# Patient Record
Sex: Male | Born: 1980 | Race: White | Hispanic: No | Smoking: Current every day smoker
Health system: Southern US, Community
[De-identification: ages and names within clinical notes are randomized; demographics above are authoritative.]

## PROBLEM LIST (undated history)

## (undated) DIAGNOSIS — F32A Depression, unspecified: Secondary | ICD-10-CM

## (undated) DIAGNOSIS — F329 Major depressive disorder, single episode, unspecified: Secondary | ICD-10-CM

## (undated) DIAGNOSIS — F1910 Other psychoactive substance abuse, uncomplicated: Secondary | ICD-10-CM

## (undated) HISTORY — DX: Depression, unspecified: F32.A

## (undated) HISTORY — DX: Major depressive disorder, single episode, unspecified: F32.9

## (undated) HISTORY — DX: Other psychoactive substance abuse, uncomplicated: F19.10

---
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
## 2004-09-11 ENCOUNTER — Emergency Department (HOSPITAL_COMMUNITY): Admission: EM | Admit: 2004-09-11 | Discharge: 2004-09-11 | Payer: Self-pay | Admitting: Emergency Medicine

## 2004-10-22 ENCOUNTER — Emergency Department (HOSPITAL_COMMUNITY): Admission: EM | Admit: 2004-10-22 | Discharge: 2004-10-22 | Payer: Self-pay | Admitting: Emergency Medicine

## 2005-02-10 ENCOUNTER — Emergency Department (HOSPITAL_COMMUNITY): Admission: EM | Admit: 2005-02-10 | Discharge: 2005-02-10 | Payer: Self-pay | Admitting: Emergency Medicine

## 2008-08-17 ENCOUNTER — Emergency Department (HOSPITAL_COMMUNITY): Admission: EM | Admit: 2008-08-17 | Discharge: 2008-08-17 | Payer: Self-pay | Admitting: Emergency Medicine

## 2008-08-26 ENCOUNTER — Emergency Department (HOSPITAL_COMMUNITY): Admission: EM | Admit: 2008-08-26 | Discharge: 2008-08-27 | Payer: Self-pay | Admitting: Emergency Medicine

## 2009-09-11 ENCOUNTER — Emergency Department (HOSPITAL_COMMUNITY): Admission: EM | Admit: 2009-09-11 | Discharge: 2009-09-11 | Payer: Self-pay | Admitting: Emergency Medicine

## 2010-08-14 NOTE — Discharge Summary (Signed)
[facility]  Patient:    Jermaine Mccann, Jermaine Mccann Visit Number: [id number] MRN: [id number]          Service Type: OBS Location: 3A A328 01 Attending Physician:  Lilyan Punt Dictated by:   Lilyan Punt, M.D. Admit Date:  05/20/2001 Discharge Date: 05/22/2001                             Discharge Summary  OBSERVATION NOTE/DISCHARGE:  CHIEF COMPLAINT:  Muscle aches, fever, cough, abdominal pain.  HISTORY OF PRESENT ILLNESS:  An 30 year old male who presented with head congestion, body aches, runny nose, cough, fever, chills, abdominal pains to the ED along with some vomiting.  Had been well previous.  PAST MEDICAL HISTORY:  No significant health problems.  ALLERGIES:  None.  SOCIAL HISTORY:  Lives with mother.  Does smoke.  FAMILY HISTORY:  Noncontributory.  REVIEW OF SYSTEMS:  Per above.  Mild headache.  No neck stiffness.  No rectal bleeding, no dysuria.  Has not noticed a decrease in urine frequency.  LABORATORY DATA:  A normal white count with a left shift.  CHEST X-RAY:  Nonspecific abdominal CT shows left lower lobe pneumonia and no obvious sign of appendicitis.  PHYSICAL EXAMINATION:  GENERAL:  No acute distress.  HEENT:  TMs normal.  Mucous membranes moist.  NECK:  Supple.  CHEST:  Clear to auscultation and normal.  There is some slight crackles noted in the left base.  HEART:  Regular.  ABDOMEN:  Soft with tenderness throughout more on the right side than the left side.  EXTREMITIES:  No edema.  SKIN:  Warm and dry.  HOSPITAL COURSE:  The patient was admitted in observation for pneumonia and was placed on IV antibiotics and monitored closely.  A recheck of his abdomen later on the day of May 21, 2001, showed no specific tenderness.  Also some diarrhea.  A stool for WBC and culture was done but it was felt the diarrhea was secondary to a viral process.  The patient was started on a regular diet and did well with  this.  On the morning of May 22, 2001, he had crackles throughout the left base but was not in respiratory distress.  His temperature was normal at the time although he did spike some fevers during the night.  His repeat blood count was a normal white blood count with no specific shift.  His discharge diagnosis summary includes: 1. Pneumonia. 2. Flu illness. 3. Diarrhea.  DISCHARGE INSTRUCTIONS: 1. Plenty of liquids. 2. Regular diet. 3. Imodium as needed for diarrhea. 4. Zithromax Z-Pak for antibiotics. 5. Off work this week. 6. Try to quit smoking.  Certainly do not smoke while being sick. 7. Return to work on May 29, 3001. 8. Follow up sooner in the office if shortness of breath or other problems   otherwise recheck in approximately 10 days. Dictated by:   Lilyan Punt, M.D. Attending Physician:  Lilyan Punt DD:  05/22/01 TD:  05/22/01 Job: [id number]

## 2017-06-04 ENCOUNTER — Emergency Department (HOSPITAL_COMMUNITY)
Admission: EM | Admit: 2017-06-04 | Discharge: 2017-06-04 | Disposition: A | Discharge status: Home / Self Care | Payer: No Typology Code available for payment source | Attending: Emergency Medicine | Admitting: Emergency Medicine

## 2017-06-04 ENCOUNTER — Emergency Department (HOSPITAL_COMMUNITY): Payer: No Typology Code available for payment source

## 2017-06-04 ENCOUNTER — Encounter (HOSPITAL_COMMUNITY): Payer: Self-pay | Admitting: Emergency Medicine

## 2017-06-04 DIAGNOSIS — S42021A Displaced fracture of shaft of right clavicle, initial encounter for closed fracture: Secondary | ICD-10-CM | POA: Diagnosis not present

## 2017-06-04 DIAGNOSIS — Y92410 Unspecified street and highway as the place of occurrence of the external cause: Secondary | ICD-10-CM | POA: Diagnosis not present

## 2017-06-04 DIAGNOSIS — Y998 Other external cause status: Secondary | ICD-10-CM | POA: Diagnosis not present

## 2017-06-04 DIAGNOSIS — R0789 Other chest pain: Secondary | ICD-10-CM | POA: Diagnosis not present

## 2017-06-04 DIAGNOSIS — S301XXA Contusion of abdominal wall, initial encounter: Secondary | ICD-10-CM

## 2017-06-04 DIAGNOSIS — Y9389 Activity, other specified: Secondary | ICD-10-CM | POA: Diagnosis not present

## 2017-06-04 DIAGNOSIS — F17210 Nicotine dependence, cigarettes, uncomplicated: Secondary | ICD-10-CM | POA: Insufficient documentation

## 2017-06-04 LAB — CBC WITH DIFFERENTIAL/PLATELET
BASOS ABS: 0 10*3/uL (ref 0.0–0.1)
BASOS PCT: 0 %
Eosinophils Absolute: 0.1 10*3/uL (ref 0.0–0.7)
Eosinophils Relative: 1 %
HEMATOCRIT: 43.8 % (ref 39.0–52.0)
HEMOGLOBIN: 14.3 g/dL (ref 13.0–17.0)
Lymphocytes Relative: 21 %
Lymphs Abs: 2.6 10*3/uL (ref 0.7–4.0)
MCH: 30.5 pg (ref 26.0–34.0)
MCHC: 32.6 g/dL (ref 30.0–36.0)
MCV: 93.4 fL (ref 78.0–100.0)
MONOS PCT: 7 %
Monocytes Absolute: 0.8 10*3/uL (ref 0.1–1.0)
NEUTROS ABS: 8.7 10*3/uL — AB (ref 1.7–7.7)
NEUTROS PCT: 71 %
Platelets: 387 10*3/uL (ref 150–400)
RBC: 4.69 MIL/uL (ref 4.22–5.81)
RDW: 13.5 % (ref 11.5–15.5)
WBC: 12.2 10*3/uL — ABNORMAL HIGH (ref 4.0–10.5)

## 2017-06-04 LAB — COMPREHENSIVE METABOLIC PANEL
ALBUMIN: 4.3 g/dL (ref 3.5–5.0)
ALK PHOS: 53 U/L (ref 38–126)
ALT: 23 U/L (ref 17–63)
AST: 42 U/L — ABNORMAL HIGH (ref 15–41)
Anion gap: 15 (ref 5–15)
BILIRUBIN TOTAL: 0.6 mg/dL (ref 0.3–1.2)
BUN: 6 mg/dL (ref 6–20)
CO2: 22 mmol/L (ref 22–32)
CREATININE: 0.78 mg/dL (ref 0.61–1.24)
Calcium: 9 mg/dL (ref 8.9–10.3)
Chloride: 102 mmol/L (ref 101–111)
GFR calc Af Amer: >60 mL/min (ref >60)
GFR calc non Af Amer: >60 mL/min (ref >60)
GLUCOSE: 150 mg/dL — AB (ref 65–99)
Potassium: 3 mmol/L — ABNORMAL LOW (ref 3.5–5.1)
Sodium: 139 mmol/L (ref 135–145)
TOTAL PROTEIN: 7.5 g/dL (ref 6.5–8.1)

## 2017-06-04 LAB — TYPE AND SCREEN
ABO/RH(D): A NEG
Antibody Screen: NEGATIVE

## 2017-06-04 LAB — ETHANOL: Alcohol, Ethyl (B): 272 mg/dL — ABNORMAL HIGH (ref <10)

## 2017-06-04 MED ORDER — IOPAMIDOL (ISOVUE-300) INJECTION 61%
100.0000 mL | Freq: Once | INTRAVENOUS | Status: AC | PRN
  Administered 2017-06-04: 100 mL via INTRAVENOUS

## 2017-06-04 MED ORDER — HYDROMORPHONE HCL 1 MG/ML IJ SOLN
1.0000 mg | Freq: Once | INTRAMUSCULAR | Status: AC
  Administered 2017-06-04: 1 mg via INTRAVENOUS
  Filled 2017-06-04: qty 1

## 2017-06-04 MED ORDER — FENTANYL CITRATE (PF) 100 MCG/2ML IJ SOLN
100.0000 ug | Freq: Once | INTRAMUSCULAR | Status: AC
  Administered 2017-06-04: 100 ug via INTRAVENOUS
  Filled 2017-06-04: qty 2

## 2017-06-04 MED ORDER — HYDROCODONE-ACETAMINOPHEN 5-325 MG PO TABS: 2.0000 | ORAL_TABLET | ORAL | 0 refills | Status: DC | PRN

## 2017-06-04 MED ORDER — SODIUM CHLORIDE 0.9 % IV SOLN
INTRAVENOUS | Status: DC
  Administered 2017-06-04: 20:00:00 via INTRAVENOUS

## 2017-06-04 MED ORDER — IBUPROFEN 600 MG PO TABS: 600.0000 mg | ORAL_TABLET | Freq: Four times a day (QID) | ORAL | 0 refills | Status: DC | PRN
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
## 2017-06-04 NOTE — Discharge Instructions (Signed)
You have a broken clavicle (collar bone) You will need to see an orthopedic doctor in the next couple of weeks for follow up - see phone number for Dr. Romeo AppleHarrison above. Keep the large bruise Iced and compressed with the "abdominal binder" -  ER for increased pain, bleeding, shortness of breath or any worsening symptoms.   [facility]    Kari BaarsEdward Hawkins MD. Specialty: Pulmonary Disease Contact information: [address]  [address]  [place] [address]  [phone]   Syliva OvermanMargaret Simpson, MD. Specialty: [facility] Contact information: [address]  [place] [address]  [phone]   Lilyan PuntScott Luking, MD. Specialty: [facility] Contact information: [address]  [address]  [place] [address]  [phone]   Avon Gullyesfaye Fanta, MD Specialty: Internal Medicine Contact information: [address]  [place] [address]  [phone]   Catalina PizzaZach Hall, MD. Specialty: Internal Medicine Contact information: [address]  [place] [address]  [phone]    [facility] (Dr. Selena BattenKim) Specialty: Family Medicine Contact information: [address]  [place] [address]  [phone]   John GiovanniStephen Knowlton, MD. Specialty: [facility] Contact information: [address]  [address]  [place] [address]  [phone]   Carylon Perchesoy Fagan, MD. Specialty: Internal Medicine Contact information: [address]  [address]  [place] [address]  [phone]    [facility]  [address] [place] [address] [phone]  Services [facility] offers a variety of basic health services.  Services include but are not limited to: Blood pressure checks  Heart rate checks  Blood sugar checks  Urine analysis  Rapid strep tests  Pregnancy tests.  Health education and referrals  People needing more complex services will be directed to a physician  online. Using these virtual visits, doctors can evaluate and prescribe medicine and treatments. There will be no medication on-site, though [facility] will help patients fill their prescriptions at little to no cost.   For More information please go to: [email or web address]
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
## 2017-06-07 ENCOUNTER — Other Ambulatory Visit: Payer: Self-pay

## 2017-06-07 ENCOUNTER — Encounter (HOSPITAL_COMMUNITY): Payer: Self-pay | Admitting: Emergency Medicine

## 2017-06-07 DIAGNOSIS — F17210 Nicotine dependence, cigarettes, uncomplicated: Secondary | ICD-10-CM | POA: Insufficient documentation

## 2017-06-07 DIAGNOSIS — S301XXD Contusion of abdominal wall, subsequent encounter: Secondary | ICD-10-CM | POA: Insufficient documentation

## 2017-06-07 LAB — CBC
HCT: 40 % (ref 39.0–52.0)
Hemoglobin: 13.2 g/dL (ref 13.0–17.0)
MCH: 31.2 pg (ref 26.0–34.0)
MCHC: 33 g/dL (ref 30.0–36.0)
MCV: 94.6 fL (ref 78.0–100.0)
PLATELETS: 285 10*3/uL (ref 150–400)
RBC: 4.23 MIL/uL (ref 4.22–5.81)
RDW: 13.4 % (ref 11.5–15.5)
WBC: 7.8 10*3/uL (ref 4.0–10.5)
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
## 2017-06-08 ENCOUNTER — Emergency Department (HOSPITAL_COMMUNITY)
Admission: EM | Admit: 2017-06-08 | Discharge: 2017-06-08 | Disposition: A | Discharge status: Home / Self Care | Payer: No Typology Code available for payment source | Attending: Emergency Medicine | Admitting: Emergency Medicine

## 2017-06-08 ENCOUNTER — Other Ambulatory Visit: Payer: Self-pay

## 2017-06-08 ENCOUNTER — Emergency Department (HOSPITAL_COMMUNITY): Payer: No Typology Code available for payment source

## 2017-06-08 DIAGNOSIS — T148XXA Other injury of unspecified body region, initial encounter: Secondary | ICD-10-CM

## 2017-06-08 LAB — TYPE AND SCREEN
ABO/RH(D): A NEG
Antibody Screen: NEGATIVE

## 2017-06-08 LAB — COMPREHENSIVE METABOLIC PANEL
ALBUMIN: 3.9 g/dL (ref 3.5–5.0)
ALK PHOS: 58 U/L (ref 38–126)
ALT: 17 U/L (ref 17–63)
AST: 24 U/L (ref 15–41)
Anion gap: 14 (ref 5–15)
BILIRUBIN TOTAL: 0.4 mg/dL (ref 0.3–1.2)
BUN: 7 mg/dL (ref 6–20)
CALCIUM: 8.9 mg/dL (ref 8.9–10.3)
CO2: 23 mmol/L (ref 22–32)
CREATININE: 0.69 mg/dL (ref 0.61–1.24)
Chloride: 104 mmol/L (ref 101–111)
GFR calc Af Amer: >60 mL/min (ref >60)
Glucose, Bld: 102 mg/dL — ABNORMAL HIGH (ref 65–99)
POTASSIUM: 3.2 mmol/L — AB (ref 3.5–5.1)
Sodium: 141 mmol/L (ref 135–145)
TOTAL PROTEIN: 7.1 g/dL (ref 6.5–8.1)
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
## 2017-06-08 NOTE — Discharge Instructions (Signed)
Apply warm compresses and take over-the-counter medication as needed for your pain.

## 2018-02-08 DIAGNOSIS — Z139 Encounter for screening, unspecified: Secondary | ICD-10-CM

## 2018-02-08 LAB — GLUCOSE, POCT (MANUAL RESULT ENTRY): POC GLUCOSE: 87 mg/dL (ref 70–99)
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
## 2018-02-15 ENCOUNTER — Ambulatory Visit: Payer: Self-pay | Admitting: Physician Assistant

## 2018-02-15 ENCOUNTER — Encounter: Payer: Self-pay | Admitting: Physician Assistant

## 2018-02-15 ENCOUNTER — Telehealth: Payer: Self-pay

## 2018-02-15 VITALS — BP 116/68 | HR 62 | Temp 98.6°F | Ht 73.0 in | Wt 173.0 lb

## 2018-02-15 DIAGNOSIS — M25511 Pain in right shoulder: Secondary | ICD-10-CM

## 2018-02-15 DIAGNOSIS — K029 Dental caries, unspecified: Secondary | ICD-10-CM

## 2018-02-15 DIAGNOSIS — K0889 Other specified disorders of teeth and supporting structures: Secondary | ICD-10-CM

## 2018-02-15 DIAGNOSIS — Z8781 Personal history of (healed) traumatic fracture: Secondary | ICD-10-CM

## 2018-02-15 DIAGNOSIS — Z131 Encounter for screening for diabetes mellitus: Secondary | ICD-10-CM

## 2018-02-15 DIAGNOSIS — S301XXS Contusion of abdominal wall, sequela: Secondary | ICD-10-CM

## 2018-02-15 DIAGNOSIS — R109 Unspecified abdominal pain: Secondary | ICD-10-CM

## 2018-02-15 DIAGNOSIS — F1011 Alcohol abuse, in remission: Secondary | ICD-10-CM

## 2018-02-15 DIAGNOSIS — Z7689 Persons encountering health services in other specified circumstances: Secondary | ICD-10-CM

## 2018-02-15 DIAGNOSIS — F17200 Nicotine dependence, unspecified, uncomplicated: Secondary | ICD-10-CM

## 2018-02-15 DIAGNOSIS — Z13220 Encounter for screening for lipoid disorders: Secondary | ICD-10-CM

## 2018-02-15 DIAGNOSIS — L819 Disorder of pigmentation, unspecified: Secondary | ICD-10-CM

## 2018-02-15 DIAGNOSIS — E876 Hypokalemia: Secondary | ICD-10-CM

## 2018-02-15 MED ORDER — AMOXICILLIN 500 MG PO CAPS: 500.0000 mg | ORAL_CAPSULE | Freq: Three times a day (TID) | ORAL | 0 refills | Status: DC
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
## 2018-02-15 NOTE — Telephone Encounter (Signed)
Called to follow up after client visit to [facility] and appointment with [facility] for 01/28/18 .  Message on phone was client was unavailable. Will attempt at later time.  Client was to check into [facility] house for substance abuse treatment after his appointment this am.  Francee NodalPatricia R Rhylin Venters RN

## 2018-02-15 NOTE — Patient Instructions (Signed)
-  PICK UP CONTRAST AT  RADIOLOGY AT LEAST 24 HOURS PRIOR TO EXAM  -NOTHING TO EAT OR DRINK 4 HOURS BEFORE EXAM

## 2018-03-01 ENCOUNTER — Other Ambulatory Visit (HOSPITAL_COMMUNITY)
Admission: RE | Admit: 2018-03-01 | Discharge: 2018-03-01 | Disposition: A | Discharge status: Home / Self Care | Payer: Self-pay | Source: Ambulatory Visit | Attending: Physician Assistant | Admitting: Physician Assistant

## 2018-03-01 ENCOUNTER — Ambulatory Visit (HOSPITAL_COMMUNITY)
Admission: RE | Admit: 2018-03-01 | Discharge: 2018-03-01 | Disposition: A | Discharge status: Home / Self Care | Payer: Self-pay | Source: Ambulatory Visit | Attending: Physician Assistant | Admitting: Physician Assistant

## 2018-03-01 DIAGNOSIS — Z13220 Encounter for screening for lipoid disorders: Secondary | ICD-10-CM | POA: Insufficient documentation

## 2018-03-01 DIAGNOSIS — Z8781 Personal history of (healed) traumatic fracture: Secondary | ICD-10-CM | POA: Insufficient documentation

## 2018-03-01 DIAGNOSIS — F1011 Alcohol abuse, in remission: Secondary | ICD-10-CM | POA: Insufficient documentation

## 2018-03-01 DIAGNOSIS — R109 Unspecified abdominal pain: Secondary | ICD-10-CM | POA: Insufficient documentation

## 2018-03-01 DIAGNOSIS — M25511 Pain in right shoulder: Secondary | ICD-10-CM | POA: Insufficient documentation

## 2018-03-01 DIAGNOSIS — E876 Hypokalemia: Secondary | ICD-10-CM | POA: Insufficient documentation

## 2018-03-01 DIAGNOSIS — S301XXS Contusion of abdominal wall, sequela: Secondary | ICD-10-CM | POA: Insufficient documentation

## 2018-03-01 DIAGNOSIS — Z131 Encounter for screening for diabetes mellitus: Secondary | ICD-10-CM | POA: Insufficient documentation

## 2018-03-01 LAB — LIPID PANEL
Cholesterol: 176 mg/dL (ref 0–200)
HDL: 37 mg/dL — ABNORMAL LOW (ref >40)
LDL CALC: 122 mg/dL — AB (ref 0–99)
Total CHOL/HDL Ratio: 4.8 RATIO
Triglycerides: 85 mg/dL (ref <150)
VLDL: 17 mg/dL (ref 0–40)

## 2018-03-01 LAB — COMPREHENSIVE METABOLIC PANEL
ALT: 13 U/L (ref 0–44)
AST: 18 U/L (ref 15–41)
Albumin: 4.6 g/dL (ref 3.5–5.0)
Alkaline Phosphatase: 56 U/L (ref 38–126)
Anion gap: 8 (ref 5–15)
BUN: 16 mg/dL (ref 6–20)
CO2: 26 mmol/L (ref 22–32)
CREATININE: 0.95 mg/dL (ref 0.61–1.24)
Calcium: 9.4 mg/dL (ref 8.9–10.3)
Chloride: 104 mmol/L (ref 98–111)
GFR calc non Af Amer: >60 mL/min (ref >60)
Glucose, Bld: 99 mg/dL (ref 70–99)
Potassium: 3.8 mmol/L (ref 3.5–5.1)
Sodium: 138 mmol/L (ref 135–145)
Total Bilirubin: 0.6 mg/dL (ref 0.3–1.2)
Total Protein: 7.8 g/dL (ref 6.5–8.1)

## 2018-03-01 LAB — CBC
HCT: 48.1 % (ref 39.0–52.0)
Hemoglobin: 15.9 g/dL (ref 13.0–17.0)
MCH: 31.1 pg (ref 26.0–34.0)
MCHC: 33.1 g/dL (ref 30.0–36.0)
MCV: 94.1 fL (ref 80.0–100.0)
Platelets: 245 10*3/uL (ref 150–400)
RBC: 5.11 MIL/uL (ref 4.22–5.81)
RDW: 11.9 % (ref 11.5–15.5)
WBC: 6.1 10*3/uL (ref 4.0–10.5)
nRBC: 0 % (ref 0.0–0.2)

## 2018-03-01 LAB — HEMOGLOBIN A1C
Hgb A1c MFr Bld: 5 % (ref 4.8–5.6)
Mean Plasma Glucose: 96.8 mg/dL

## 2018-03-03 ENCOUNTER — Ambulatory Visit (HOSPITAL_COMMUNITY)
Admission: RE | Admit: 2018-03-03 | Discharge: 2018-03-03 | Disposition: A | Discharge status: Home / Self Care | Payer: Self-pay | Source: Ambulatory Visit | Attending: Physician Assistant | Admitting: Physician Assistant

## 2018-03-03 DIAGNOSIS — S301XXS Contusion of abdominal wall, sequela: Secondary | ICD-10-CM | POA: Insufficient documentation

## 2018-03-03 DIAGNOSIS — R109 Unspecified abdominal pain: Secondary | ICD-10-CM | POA: Insufficient documentation

## 2018-03-03 MED ORDER — IOPAMIDOL (ISOVUE-300) INJECTION 61%
100.0000 mL | Freq: Once | INTRAVENOUS | Status: AC | PRN
  Administered 2018-03-03: 100 mL via INTRAVENOUS

## 2018-03-06 ENCOUNTER — Encounter: Payer: Self-pay | Admitting: Physician Assistant

## 2018-03-06 ENCOUNTER — Ambulatory Visit: Payer: Self-pay | Admitting: Physician Assistant

## 2018-03-06 VITALS — BP 104/60 | HR 79 | Temp 98.1°F | Ht 73.0 in | Wt 178.0 lb

## 2018-03-06 DIAGNOSIS — F17200 Nicotine dependence, unspecified, uncomplicated: Secondary | ICD-10-CM

## 2018-03-06 DIAGNOSIS — Z8781 Personal history of (healed) traumatic fracture: Secondary | ICD-10-CM

## 2018-03-06 DIAGNOSIS — F1011 Alcohol abuse, in remission: Secondary | ICD-10-CM

## 2018-03-06 DIAGNOSIS — E785 Hyperlipidemia, unspecified: Secondary | ICD-10-CM

## 2018-03-06 DIAGNOSIS — M25511 Pain in right shoulder: Secondary | ICD-10-CM

## 2018-03-06 NOTE — Progress Notes (Signed)
BP 104/60 (BP Location: Left Arm, Patient Position: Sitting, Cuff Size: Normal)   Pulse 79   Temp 98.1 F (36.7 C) (Other (Comment))   Ht 6\' 1"  (1.854 m)   Wt 178 lb (80.7 kg)   SpO2 98%   BMI 23.48 kg/m    Subjective:    Patient ID: Jermaine Mccann, male    DOB: 06/19/1980, 37 y.o.   MRN: [id number]  HPI: Jermaine Mccann is a 37 y.o. male presenting on 03/06/2018 for Follow-up   HPI   Pt is at [facility] house now.   He got mole off abdominal wall at dermatologist  Pt has appointment with psychiatrist later this week.   Pt says things are going well for him.    He continues to have pain and limited ROM R shoulder.     Relevant past medical, surgical, family and social history reviewed and updated as indicated. Interim medical history since our last visit reviewed. Allergies and medications reviewed and updated.   Current Outpatient Medications:  .  ibuprofen (ADVIL,MOTRIN) 200 MG tablet, Take 200 mg by mouth every 6 (six) hours as needed., Disp: , Rfl:     Review of Systems  Constitutional: Negative for appetite change, chills, diaphoresis, fatigue, fever and unexpected weight change.  HENT: Positive for congestion and dental problem. Negative for drooling, ear pain, facial swelling, hearing loss, mouth sores, sneezing, sore throat, trouble swallowing and voice change.   Eyes: Negative for pain, discharge, redness, itching and visual disturbance.  Respiratory: Negative for cough, choking, shortness of breath and wheezing.   Cardiovascular: Negative for chest pain, palpitations and leg swelling.  Gastrointestinal: Positive for abdominal pain. Negative for blood in stool, constipation, diarrhea and vomiting.  Endocrine: Negative for cold intolerance, heat intolerance and polydipsia.  Genitourinary: Negative for decreased urine volume, dysuria and hematuria.  Musculoskeletal: Negative for arthralgias, back pain and gait problem.  Skin: Negative for rash.   Allergic/Immunologic: Negative for environmental allergies.  Neurological: Negative for seizures, syncope, light-headedness and headaches.  Hematological: Negative for adenopathy.  Psychiatric/Behavioral: Positive for dysphoric mood. Negative for agitation and suicidal ideas. The patient is nervous/anxious.     Per HPI unless specifically indicated above     Objective:    BP 104/60 (BP Location: Left Arm, Patient Position: Sitting, Cuff Size: Normal)   Pulse 79   Temp 98.1 F (36.7 C) (Other (Comment))   Ht 6\' 1"  (1.854 m)   Wt 178 lb (80.7 kg)   SpO2 98%   BMI 23.48 kg/m   Wt Readings from Last 3 Encounters:  03/06/18 178 lb (80.7 kg)  02/15/18 173 lb (78.5 kg)  02/09/18 171 lb 12.8 oz (77.9 kg)    Physical Exam  Constitutional: He is oriented to person, place, and time. He appears well-developed and well-nourished.  HENT:  Head: Normocephalic and atraumatic.  Neck: Neck supple.  Cardiovascular: Normal rate and regular rhythm.  Pulmonary/Chest: Effort normal and breath sounds normal. He has no wheezes.  Abdominal: Soft. Bowel sounds are normal. There is no hepatosplenomegaly. There is no tenderness.  Musculoskeletal: He exhibits no edema.       Right shoulder: He exhibits decreased range of motion and tenderness.  Lymphadenopathy:    He has no cervical adenopathy.  Neurological: He is alert and oriented to person, place, and time.  Skin: Skin is warm and dry.  Psychiatric: He has a normal mood and affect. His behavior is normal.  Vitals reviewed.   Results for orders placed  or performed during the hospital encounter of 03/01/18  CBC  Result Value Ref Range   WBC 6.1 4.0 - 10.5 K/uL   RBC 5.11 4.22 - 5.81 MIL/uL   Hemoglobin 15.9 13.0 - 17.0 g/dL   HCT 16.148.1 09.639.0 - 04.552.0 %   MCV 94.1 80.0 - 100.0 fL   MCH 31.1 26.0 - 34.0 pg   MCHC 33.1 30.0 - 36.0 g/dL   RDW 40.911.9 81.111.5 - 91.415.5 %   Platelets 245 150 - 400 K/uL   nRBC 0.0 0.0 - 0.2 %  Hemoglobin A1c  Result Value  Ref Range   Hgb A1c MFr Bld 5.0 4.8 - 5.6 %   Mean Plasma Glucose 96.8 mg/dL  Lipid panel  Result Value Ref Range   Cholesterol 176 0 - 200 mg/dL   Triglycerides 85 <782<150 mg/dL   HDL 37 (L) >95>40 mg/dL   Total CHOL/HDL Ratio 4.8 RATIO   VLDL 17 0 - 40 mg/dL   LDL Cholesterol 621122 (H) 0 - 99 mg/dL  Comprehensive metabolic panel  Result Value Ref Range   Sodium 138 135 - 145 mmol/L   Potassium 3.8 3.5 - 5.1 mmol/L   Chloride 104 98 - 111 mmol/L   CO2 26 22 - 32 mmol/L   Glucose, Bld 99 70 - 99 mg/dL   BUN 16 6 - 20 mg/dL   Creatinine, Ser 3.080.95 0.61 - 1.24 mg/dL   Calcium 9.4 8.9 - 65.710.3 mg/dL   Total Protein 7.8 6.5 - 8.1 g/dL   Albumin 4.6 3.5 - 5.0 g/dL   AST 18 15 - 41 U/L   ALT 13 0 - 44 U/L   Alkaline Phosphatase 56 38 - 126 U/L   Total Bilirubin 0.6 0.3 - 1.2 mg/dL   GFR calc non Af Amer >60 >60 mL/min   GFR calc Af Amer >60 >60 mL/min   Anion gap 8 5 - 15      Assessment & Plan:   Encounter Diagnoses  Name Primary?  . Right shoulder pain, unspecified chronicity Yes  . H/O clavicle fracture   . Alcohol abuse, in remission   . Tobacco use disorder   . Hyperlipidemia, unspecified hyperlipidemia type      -reviwewed labs with pt.  Reviewed imaging studies with pt -Refer to orthopedics.  Physical Therapy may be best option but will have orthopedics evaluate and make recomendations -Counseled lowfat diet -pt reminded [facility] -[facility] -counseled pt on hyperlipidemia.  Recommended lowfat diet and regular exercise -pt to follow up 5 months.  RTO sooner prn

## 2018-03-06 NOTE — Patient Instructions (Signed)

## 2018-03-16 ENCOUNTER — Encounter: Payer: Self-pay | Admitting: Student

## 2018-04-04 ENCOUNTER — Ambulatory Visit: Payer: Self-pay | Admitting: Physician Assistant

## 2018-04-04 ENCOUNTER — Encounter: Payer: Self-pay | Admitting: Physician Assistant

## 2018-04-04 VITALS — BP 119/54 | HR 63 | Temp 100.2°F | Wt 179.8 lb

## 2018-04-04 DIAGNOSIS — K0889 Other specified disorders of teeth and supporting structures: Secondary | ICD-10-CM

## 2018-04-04 MED ORDER — AMOXICILLIN 500 MG PO CAPS: 500.0000 mg | ORAL_CAPSULE | Freq: Three times a day (TID) | ORAL | 0 refills | Status: DC

## 2018-04-04 NOTE — Progress Notes (Signed)
There were no vitals taken for this visit.   Subjective:    Patient ID: Jermaine Mccann, male    DOB: November 04, 1980, 38 y.o.   MRN: [id number]  HPI: Jermaine Mccann is a 38 y.o. male presenting on 04/04/2018 for Dental Pain   HPI   Chief Complaint  Patient presents with  . Dental Pain    front bottom. swollen, draining, painful. sx began 03-27-2018. pt has taken ibu for pain and orajel which helps a little.    Relevant past medical, surgical, family and social history reviewed and updated as indicated. Interim medical history since our last visit reviewed. Allergies and medications reviewed and updated.   Current Outpatient Medications:  .  citalopram (CELEXA) 20 MG tablet, Take 20 mg by mouth daily., Disp: , Rfl:  .  ibuprofen (ADVIL,MOTRIN) 200 MG tablet, Take 200 mg by mouth every 6 (six) hours as needed., Disp: , Rfl:  .  TRAZODONE HCL PO, Take 1 tablet by mouth at bedtime., Disp: , Rfl:    Review of Systems  Constitutional: Negative for appetite change, chills, diaphoresis, fatigue, fever and unexpected weight change.  HENT: Positive for dental problem and mouth sores. Negative for congestion, drooling, ear pain, facial swelling, hearing loss, sneezing, sore throat, trouble swallowing and voice change.   Eyes: Negative for pain, discharge, redness, itching and visual disturbance.  Respiratory: Negative for cough, choking, shortness of breath and wheezing.   Cardiovascular: Negative for chest pain, palpitations and leg swelling.  Gastrointestinal: Negative for abdominal pain, blood in stool, constipation, diarrhea and vomiting.  Endocrine: Negative for cold intolerance, heat intolerance and polydipsia.  Genitourinary: Negative for decreased urine volume, dysuria and hematuria.  Musculoskeletal: Negative for arthralgias, back pain and gait problem.  Skin: Negative for rash.  Allergic/Immunologic: Negative for environmental allergies.  Neurological: Negative for seizures,  syncope, light-headedness and headaches.  Hematological: Negative for adenopathy.  Psychiatric/Behavioral: Negative for agitation, dysphoric mood and suicidal ideas. The patient is not nervous/anxious.     Per HPI unless specifically indicated above     Objective:      Vitals:   04/04/18 1302  BP: (!) 119/54  Pulse: 63  Temp: 100.2 F (37.9 C)  SpO2: 97%    There were no vitals taken for this visit.  Wt Readings from Last 3 Encounters:  03/06/18 178 lb (80.7 kg)  02/15/18 173 lb (78.5 kg)  02/09/18 171 lb 12.8 oz (77.9 kg)    Physical Exam Vitals signs and nursing note reviewed.  Constitutional:      Appearance: Normal appearance.  HENT:     Head: Normocephalic and atraumatic.     Mouth/Throat:     Mouth: Mucous membranes are moist.     Dentition: Abnormal dentition. Dental tenderness and dental caries present. No dental abscesses.     Pharynx: Uvula midline. No uvula swelling.     Comments: No facial swelling Neck:     Musculoskeletal: Neck supple.  Pulmonary:     Effort: Pulmonary effort is normal. No respiratory distress.  Skin:    General: Skin is warm and dry.  Neurological:     Mental Status: He is alert and oriented to person, place, and time.  Psychiatric:        Mood and Affect: Mood normal.        Behavior: Behavior normal.         Assessment & Plan:     Encounter Diagnosis  Name Primary?  Norva Riffle. Dentalgia Yes    -  rx amoxil -Dental list -Pt to follow up as scheduled.  RTO sooner prn

## 2018-08-07 ENCOUNTER — Ambulatory Visit: Payer: Self-pay | Admitting: Physician Assistant

## 2018-08-07 ENCOUNTER — Encounter: Payer: Self-pay | Admitting: Physician Assistant

## 2018-08-07 DIAGNOSIS — M25511 Pain in right shoulder: Secondary | ICD-10-CM

## 2018-08-07 DIAGNOSIS — F1011 Alcohol abuse, in remission: Secondary | ICD-10-CM

## 2018-08-07 DIAGNOSIS — F17200 Nicotine dependence, unspecified, uncomplicated: Secondary | ICD-10-CM

## 2018-08-07 NOTE — Progress Notes (Signed)
   There were no vitals taken for this visit.   Subjective:    Patient ID: Jermaine Mccann, male    DOB: July 13, 1980, 38 y.o.   MRN: [id number]  HPI: Jermaine Mccann is a 38 y.o. male presenting on 08/07/2018 for No chief complaint on file.   HPI  This is a telemedicine visit due to coronavirus pandemic.  It is via Telephone as pt does not have a smartphone.  Pt is no longer at [facility] house.   He left to go back to work to help his mother.  He says he is doing great.   He is still sober-  He is coming up on 6 months in 4 days.  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  His only issue is with shoulder and back.  He has PT appt but is on hold due to CV19.  He Hasn't been back to [facility] lately due to CV19 so is out of citalopram and trazodone.  Says that isn't so back except for trying to sleep.   Relevant past medical, surgical, family and social history reviewed and updated as indicated. Interim medical history since our last visit reviewed. Allergies and medications reviewed and updated.   Current Outpatient Medications:  .  ibuprofen (ADVIL,MOTRIN) 200 MG tablet, Take 200 mg by mouth every 6 (six) hours as needed., Disp: , Rfl:    Review of Systems  Per HPI unless specifically indicated above     Objective:    There were no vitals taken for this visit.  Wt Readings from Last 3 Encounters:  04/04/18 179 lb 12 oz (81.5 kg)  03/06/18 178 lb (80.7 kg)  02/15/18 173 lb (78.5 kg)    Physical Exam Pulmonary:     Effort: Pulmonary effort is normal. No respiratory distress.  Neurological:     Mental Status: He is alert and oriented to person, place, and time.  Psychiatric:        Attention and Perception: Attention normal.        Mood and Affect: Mood normal.        Speech: Speech normal.        Behavior: Behavior is cooperative.        Thought Content: Thought content normal.         Assessment & Plan:    Encounter Diagnoses  Name  Primary?  . Right shoulder pain, unspecified chronicity Yes  . Alcohol abuse, in remission   . Tobacco use disorder     -pt congratulated on staying off etoh -encouraged him to return to [facility] -pt to follow up here 4 month.  He is to contact office sooner prn

## 2018-12-11 ENCOUNTER — Ambulatory Visit: Payer: Self-pay | Admitting: Physician Assistant

## 2019-01-03 ENCOUNTER — Other Ambulatory Visit: Payer: Self-pay | Admitting: *Deleted

## 2019-01-03 DIAGNOSIS — Z20822 Contact with and (suspected) exposure to covid-19: Secondary | ICD-10-CM

## 2019-01-05 LAB — NOVEL CORONAVIRUS, NAA: SARS-CoV-2, NAA: NOT DETECTED

## 2019-12-24 ENCOUNTER — Other Ambulatory Visit: Payer: Self-pay | Admitting: Critical Care Medicine

## 2019-12-24 ENCOUNTER — Other Ambulatory Visit: Payer: 59

## 2019-12-24 DIAGNOSIS — Z20822 Contact with and (suspected) exposure to covid-19: Secondary | ICD-10-CM

## 2019-12-26 LAB — SPECIMEN STATUS REPORT

## 2019-12-26 LAB — NOVEL CORONAVIRUS, NAA: SARS-CoV-2, NAA: NOT DETECTED

## 2019-12-26 LAB — SARS-COV-2, NAA 2 DAY TAT

## 2021-07-18 ENCOUNTER — Other Ambulatory Visit: Payer: Self-pay

## 2021-07-18 ENCOUNTER — Emergency Department (HOSPITAL_COMMUNITY): Payer: 59

## 2021-07-18 ENCOUNTER — Encounter (HOSPITAL_COMMUNITY): Payer: Self-pay

## 2021-07-18 ENCOUNTER — Emergency Department (HOSPITAL_COMMUNITY)
Admission: EM | Admit: 2021-07-18 | Discharge: 2021-07-18 | Disposition: A | Discharge status: Home / Self Care | Payer: 59 | Attending: Emergency Medicine | Admitting: Emergency Medicine

## 2021-07-18 DIAGNOSIS — R6884 Jaw pain: Secondary | ICD-10-CM | POA: Diagnosis present

## 2021-07-18 NOTE — Discharge Instructions (Signed)
Your strays do not show any obvious source of today's episode, but I do suspect you may have had a jaw partial dislocation which resolved on its own since you have regained your range of motion.  Use caution with chewing until the soreness heals.  Continue taking your meloxicam, also recommend an ice pack to your jaw which can help with the swelling as well.  You may consider seeing Dr. Benjamine Mola for consultation if you continue to have problems with your jaw. ?

## 2021-07-18 NOTE — ED Triage Notes (Signed)
Reports was eating and felt a pop in left jaw now having pain and numbness to left jaw.  ?

## 2021-07-18 NOTE — ED Provider Notes (Signed)
?[facility] ?Provider Note ? ? ?CSN: [id number] ?Arrival date & time: 07/18/21  1456 ? ?  ? ?History ? ?Chief Complaint  ?Patient presents with  ? Jaw Pain  ? ? ?Jermaine Mccann is a 41 y.o. male presenting with left jaw pain and swelling which started suddenly around 37 today when he was eating a meal.  The patient is a dentulous, he does have dentures but does not wear them as he states they hurt his mouth.  He was eating without dentures when he had a brief locking sensation in his jaw associated with sharp pain and popping after which he continued to have pain in noted swelling, however the swelling has improved since arriving here.  He denies prior history of similar symptoms.  He is able to move his jaw currently although full extension of the mandible is still painful.  He has had no medications or treatment prior to arrival.  Denies prior history of similar symptoms. ? ?The history is provided by the patient.  ? ?  ? ?Home Medications ?Prior to Admission medications   ?Medication Sig Start Date End Date Taking? Authorizing Provider  ?ibuprofen (ADVIL,MOTRIN) 200 MG tablet Take 200 mg by mouth every 6 (six) hours as needed.    [provider]  ?   ? ?Allergies    ?Patient has no known allergies.   ? ?Review of Systems   ?Review of Systems  ?Constitutional:  Negative for chills and fever.  ?HENT:  Positive for facial swelling. Negative for congestion, dental problem and sore throat.   ?Eyes: Negative.   ?Cardiovascular:  Negative for chest pain.  ?Gastrointestinal:  Negative for abdominal pain and nausea.  ?Genitourinary: Negative.   ?Musculoskeletal:  Positive for arthralgias. Negative for joint swelling and neck pain.  ?Skin: Negative.  Negative for rash and wound.  ?Neurological:  Negative for headaches.  ?Psychiatric/Behavioral: Negative.    ? ?Physical Exam ?Updated Vital Signs ?BP 133/74 (BP Location: Right Arm)   Pulse 65   Temp 98.3 ?F (36.8 ?C) (Oral)   Resp 20   Ht  6\' 1"  (1.854 m)   Wt 81.6 kg   SpO2 99%   BMI 23.75 kg/m?  ?Physical Exam ?Constitutional:   ?   General: He is not in acute distress. ?   Appearance: He is well-developed.  ?HENT:  ?   Head: Normocephalic and atraumatic.  ?   Jaw: There is normal jaw occlusion. Tenderness, swelling and pain on movement present. No trismus or malocclusion.  ?   Comments: There is some mild edema noted over his left TM joint.  He is tender to palpation at the site without palpable deformity.  There is no crepitus with range of motion.  He does not have an obvious malocclusion.  He is unable to fully close gum to gum, but states this is his normal edentulous state.  He can extend the mandible moderately but with discomfort, also has lateral range of motion with the mandible but also tender. ?   Right Ear: Tympanic membrane and external ear normal. No mastoid tenderness.  ?   Left Ear: Tympanic membrane and external ear normal. No mastoid tenderness.  ?   Mouth/Throat:  ?   Mouth: Mucous membranes are moist. No oral lesions.  ?   Pharynx: Oropharynx is clear. No posterior oropharyngeal erythema.  ?Eyes:  ?   Conjunctiva/sclera: Conjunctivae normal.  ?Cardiovascular:  ?   Rate and Rhythm: Normal rate.  ?  Heart sounds: Normal heart sounds.  ?Pulmonary:  ?   Effort: Pulmonary effort is normal.  ?Abdominal:  ?   General: There is no distension.  ?Musculoskeletal:     ?   General: Normal range of motion.  ?   Cervical back: Normal range of motion and neck supple.  ?Lymphadenopathy:  ?   Cervical: No cervical adenopathy.  ?Skin: ?   General: Skin is warm and dry.  ?   Findings: No erythema.  ?Neurological:  ?   Mental Status: He is alert and oriented to person, place, and time.  ? ? ?ED Results / Procedures / Treatments   ?Labs ?(all labs ordered are listed, but only abnormal results are displayed) ?Labs Reviewed - No data to display ? ?EKG ?None ? ?Radiology ?DG Mandible 4 Views ? ?Result Date: 07/18/2021 ?CLINICAL DATA:  LEFT jaw pain  and numbness during eating. EXAM: MANDIBLE - 4+ VIEW COMPARISON:  None. FINDINGS: There is no evidence of fracture or other focal bone lesions. The patient is edentulous. IMPRESSION: Negative. Consider CT or MRI for further evaluation as clinically indicated. Electronically Signed   By: Harmon Pier M.D.   On: 07/18/2021 17:28   ? ?Procedures ?Procedures  ? ? ?Medications Ordered in ED ?Medications - No data to display ? ?ED Course/ Medical Decision Making/ A&P ?  ?                        ?Medical Decision Making ?Patient with sudden onset of severe left TMJ pain with chewing, improved but not resolved.  No obvious abnormalities on plain film imaging except for his edentulous state.  No fracture.  There is no current dislocation on exam.  I suspect he may have had a brief subluxation which has resolved but has resulted in acute ongoing pain and soft tissue swelling.  There is no evidence for infection.  He is currently on meloxicam for other chronic medical problems, he was encouraged to continue taking this medicine, also encouraged ice pack to the joint, soft food intake to avoid needing to chew.  Plan follow-up with either his PCP or ENT which a referral was given. ? ?Amount and/or Complexity of Data Reviewed ?Radiology: ordered and independent interpretation performed. ?   Details: No obvious fracture or dislocation. ? ? ? ? ? ? ? ? ? ? ?Final Clinical Impression(s) / ED Diagnoses ?Final diagnoses:  ?Mandible pain  ? ? ?Rx / DC Orders ?ED Discharge Orders   ? ? None  ? ?  ? ? ?  ?Burgess Amor, PA-C ?07/18/21 1930 ? ?  ?Terrilee Files, MD ?07/19/21 1031 ? ?

## 2023-10-07 IMAGING — DX DG MANDIBLE 4+V
4 series · 4 of 4 positions shown · non-contrast
Comparison: None.

CLINICAL DATA: LEFT jaw pain and numbness during eating.

EXAM:
MANDIBLE - 4+ VIEW

[mandible pa]
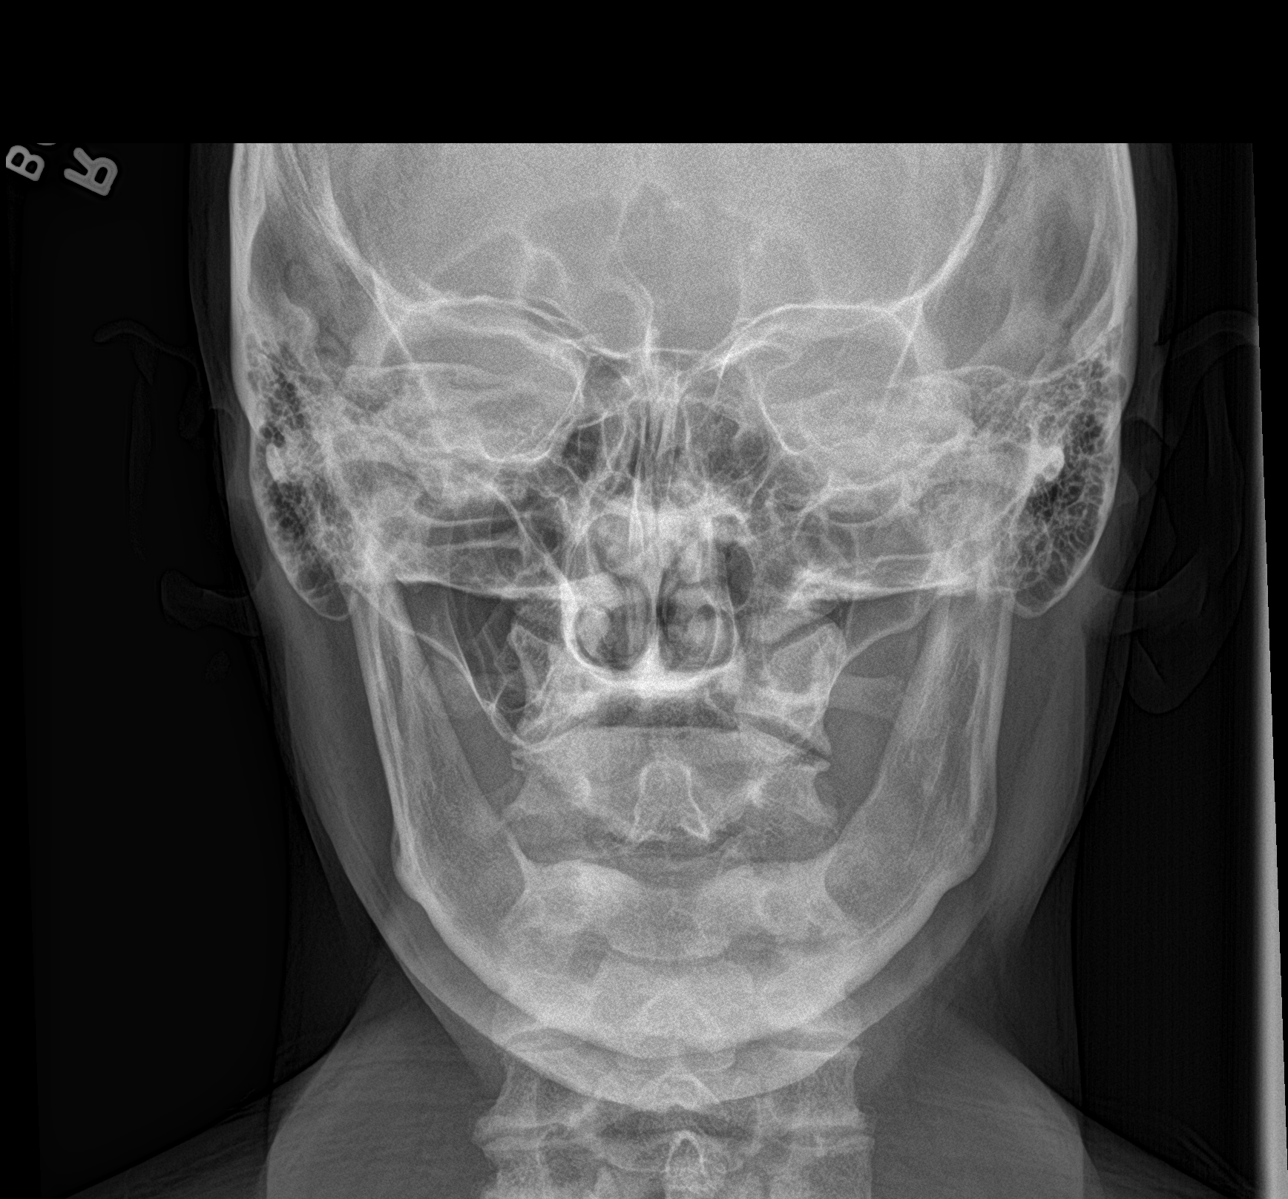

[mandible townes]
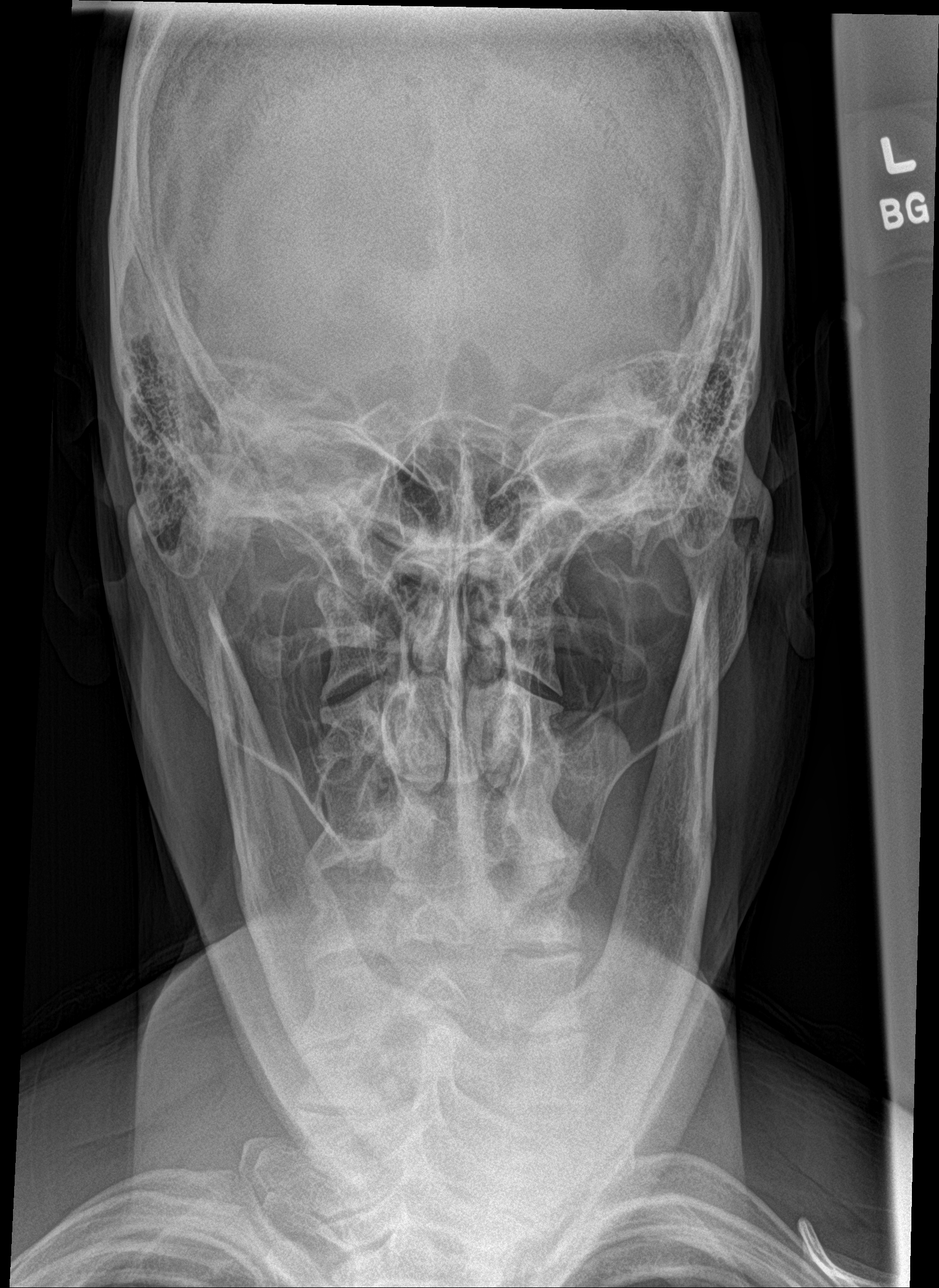

[mandible lat (1 of 2)]
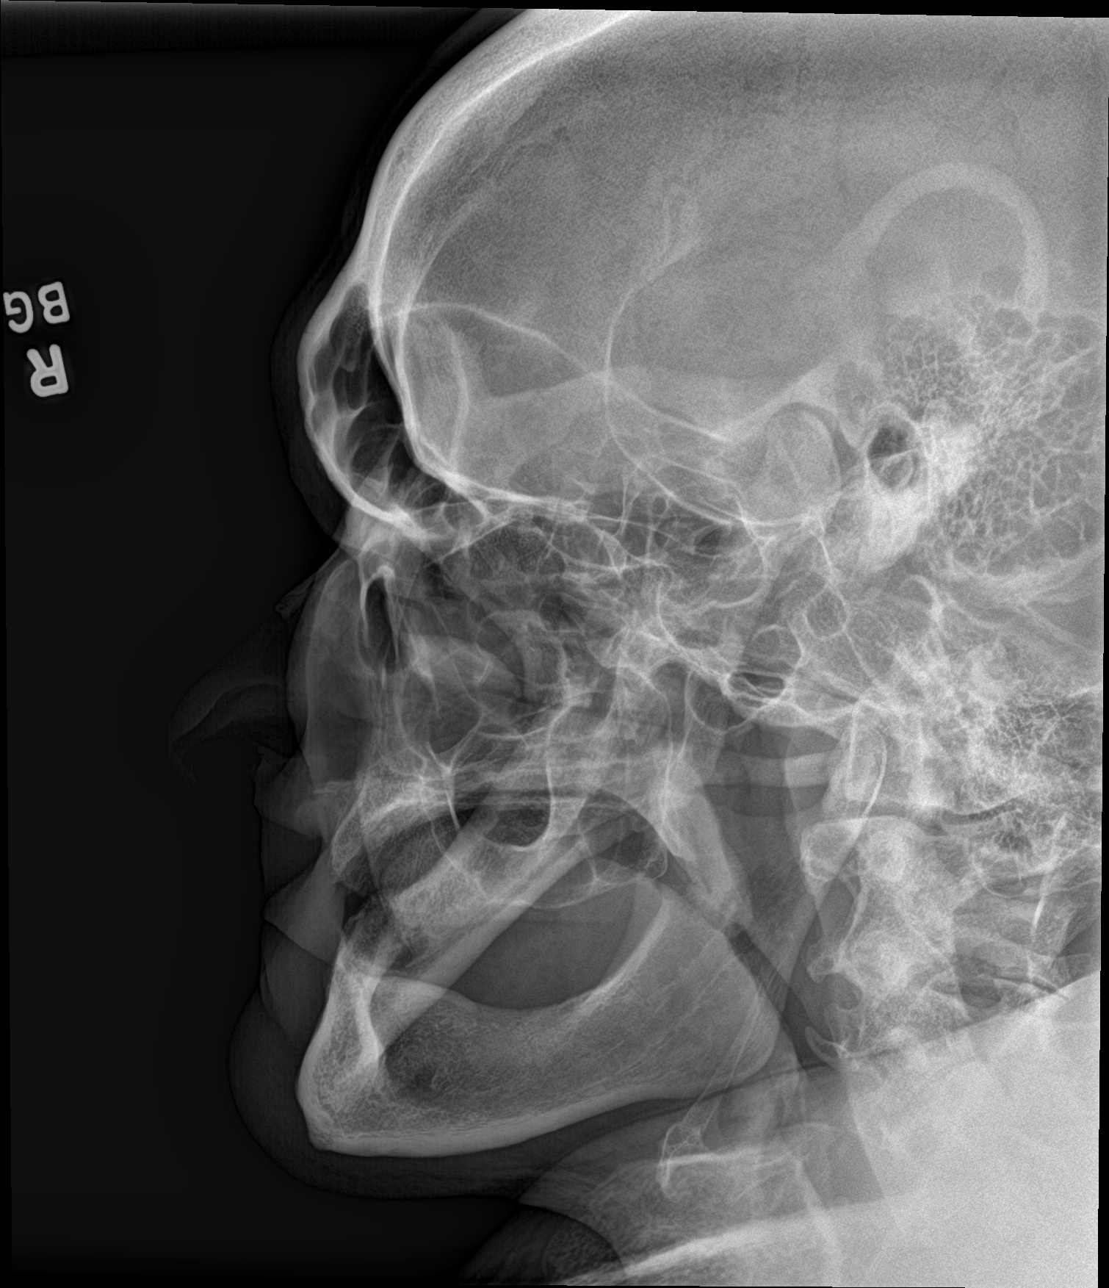

[mandible lat (2 of 2)]
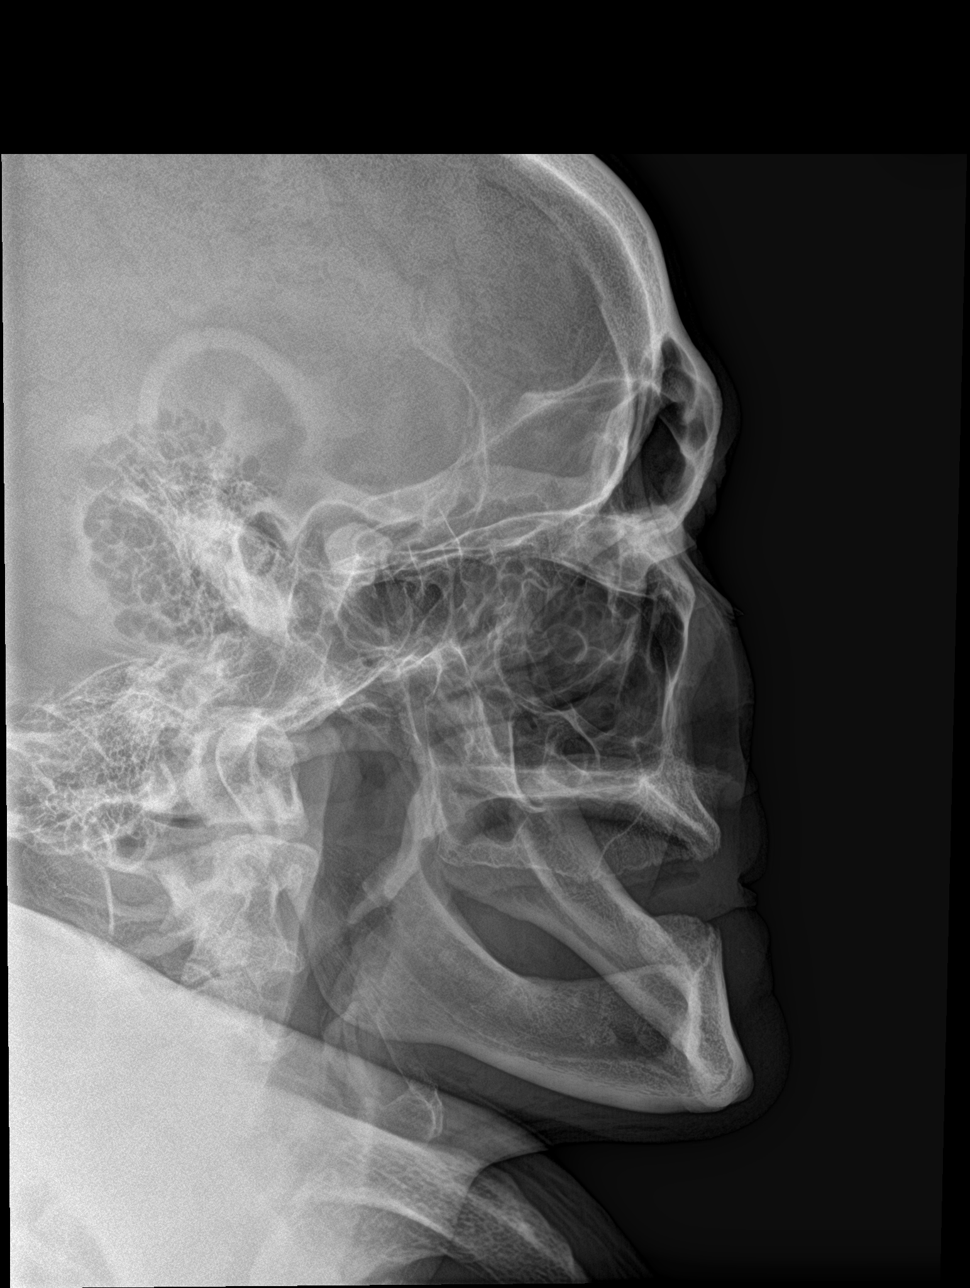

[4 of 4 positions shown; findings below may reference images not displayed]

FINDINGS: There is no evidence of fracture or other focal bone lesions. The
patient is edentulous.
IMPRESSION: Negative. Consider CT or MRI for further evaluation as clinically
indicated.
